# Patient Record
Sex: Female | Born: 1995 | Race: Black or African American | Hispanic: No | Marital: Single | Smoking: Current every day smoker
Health system: Southern US, Community
[De-identification: ages and names within clinical notes are randomized; demographics above are authoritative.]

## PROBLEM LIST (undated history)

## (undated) DIAGNOSIS — F32A Depression, unspecified: Secondary | ICD-10-CM

## (undated) DIAGNOSIS — B009 Herpesviral infection, unspecified: Secondary | ICD-10-CM

## (undated) DIAGNOSIS — G4730 Sleep apnea, unspecified: Secondary | ICD-10-CM

## (undated) DIAGNOSIS — F419 Anxiety disorder, unspecified: Secondary | ICD-10-CM

## (undated) DIAGNOSIS — T7840XA Allergy, unspecified, initial encounter: Secondary | ICD-10-CM

## (undated) DIAGNOSIS — F329 Major depressive disorder, single episode, unspecified: Secondary | ICD-10-CM

## (undated) HISTORY — DX: Sleep apnea, unspecified: G47.30

## (undated) HISTORY — DX: Major depressive disorder, single episode, unspecified: F32.9

## (undated) HISTORY — DX: Allergy, unspecified, initial encounter: T78.40XA

## (undated) HISTORY — DX: Anxiety disorder, unspecified: F41.9

## (undated) HISTORY — DX: Depression, unspecified: F32.A

---

## 2016-01-05 ENCOUNTER — Emergency Department (HOSPITAL_COMMUNITY)
Admission: EM | Admit: 2016-01-05 | Discharge: 2016-01-05 | Disposition: A | Discharge status: Home / Self Care | Payer: 59 | Attending: Emergency Medicine | Admitting: Emergency Medicine

## 2016-01-05 ENCOUNTER — Encounter (HOSPITAL_COMMUNITY): Payer: Self-pay | Admitting: Emergency Medicine

## 2016-01-05 DIAGNOSIS — F17200 Nicotine dependence, unspecified, uncomplicated: Secondary | ICD-10-CM | POA: Diagnosis not present

## 2016-01-05 DIAGNOSIS — N766 Ulceration of vulva: Secondary | ICD-10-CM | POA: Insufficient documentation

## 2016-01-05 DIAGNOSIS — N760 Acute vaginitis: Secondary | ICD-10-CM | POA: Diagnosis not present

## 2016-01-05 DIAGNOSIS — B9689 Other specified bacterial agents as the cause of diseases classified elsewhere: Secondary | ICD-10-CM | POA: Insufficient documentation

## 2016-01-05 DIAGNOSIS — N898 Other specified noninflammatory disorders of vagina: Secondary | ICD-10-CM | POA: Diagnosis present

## 2016-01-05 DIAGNOSIS — R3989 Other symptoms and signs involving the genitourinary system: Secondary | ICD-10-CM

## 2016-01-05 LAB — URINALYSIS, ROUTINE W REFLEX MICROSCOPIC
BILIRUBIN URINE: NEGATIVE
Glucose, UA: NEGATIVE mg/dL
Hgb urine dipstick: NEGATIVE
KETONES UR: NEGATIVE mg/dL
NITRITE: NEGATIVE
PROTEIN: NEGATIVE mg/dL
Specific Gravity, Urine: 1.031 — ABNORMAL HIGH (ref 1.005–1.030)
pH: 6.5 (ref 5.0–8.0)

## 2016-01-05 LAB — URINE MICROSCOPIC-ADD ON: RBC / HPF: NONE SEEN RBC/hpf (ref 0–5)

## 2016-01-05 LAB — WET PREP, GENITAL
Sperm: NONE SEEN
Trich, Wet Prep: NONE SEEN
Yeast Wet Prep HPF POC: NONE SEEN

## 2016-01-05 LAB — POC URINE PREG, ED: PREG TEST UR: NEGATIVE

## 2016-01-05 MED ORDER — FLUCONAZOLE 200 MG PO TABS: 200.0000 mg | ORAL_TABLET | Freq: Every day | ORAL | 0 refills | Status: AC

## 2016-01-05 MED ORDER — VALACYCLOVIR HCL 1 G PO TABS: 1000.0000 mg | ORAL_TABLET | Freq: Two times a day (BID) | ORAL | 0 refills | Status: AC

## 2016-01-05 MED ORDER — METRONIDAZOLE 500 MG PO TABS: 500.0000 mg | ORAL_TABLET | Freq: Two times a day (BID) | ORAL | 0 refills | Status: DC

## 2016-01-05 NOTE — ED Notes (Signed)
Pa at bedside updating pt 

## 2016-01-05 NOTE — ED Provider Notes (Signed)
[facility]PT Provider Note   CSN: [id number] Arrival date & time: 01/05/16  2115  By signing my name below, I, Linna DarnerRussell Turner, attest that this documentation has been prepared under the direction and in the presence of Kerrie BuffaloHope Neese, NP. Electronically Signed: Linna Darnerussell Turner, Scribe. 01/05/2016. 9:50 PM.  History   Chief Complaint Chief Complaint  Patient presents with  . Vaginal Discharge    The history is provided by the patient. No language interpreter was used.  Vaginal Discharge   This is a new problem. The current episode started more than 2 days ago. The problem occurs daily. The problem has not changed since onset.The discharge occurs spontaneously. The discharge was thick and yellow. She is not pregnant. Associated symptoms include abdominal pain, dysuria, genital burning, genital itching, genital lesions and perineal pain. Pertinent negatives include no fever, no constipation, no diarrhea, no nausea, no vomiting and no frequency. She has tried nothing for the symptoms.     HPI Comments: Summer Martin is a 20 y.o. female who presents to the Emergency Department complaining of sudden onset, constant, vaginal discharge for the last 3-4 days. She describes the discharge as yellow-tinted and very thick. Pt also notes some left-sided abdominal pain and dysuria since onset. Pt reports some sores/lesions around her vagina and soreness between her vagina and anus. She endorses itching around her vagina. She states she is sexually active and has been on birth control in the past, but is not on birth control currently. Pt reports she has been with her current sexual partner for 7 months and sometimes uses condoms. She states she is having oral sex as well. Pt notes her current sexual partner does not have any similar symptoms to her knowledge. No treatments or medications tried. No h/o pregnancy. She denies fever, chills, frequency, urgency, or any other associated symptoms.  History  reviewed. No pertinent past medical history.  There are no active problems to display for this patient.   History reviewed. No pertinent surgical history.  OB History    No data available       Home Medications    Prior to Admission medications   Medication Sig Start Date End Date Taking? Authorizing Provider  fluconazole (DIFLUCAN) 200 MG tablet Take 1 tablet (200 mg total) by mouth daily. 01/05/16 01/12/16  Hope Orlene OchM Neese, NP  metroNIDAZOLE (FLAGYL) 500 MG tablet Take 1 tablet (500 mg total) by mouth 2 (two) times daily. 01/05/16   Hope Orlene OchM Neese, NP  valACYclovir (VALTREX) 1000 MG tablet Take 1 tablet (1,000 mg total) by mouth 2 (two) times daily. 01/05/16 01/15/16  Hope Orlene OchM Neese, NP    Family History No family history on file.  Social History Social History  Substance Use Topics  . Smoking status: Current Every Day Smoker  . Smokeless tobacco: Not on file  . Alcohol use Yes     Allergies   Review of patient's allergies indicates no known allergies.   Review of Systems Review of Systems  Constitutional: Negative for chills and fever.  Gastrointestinal: Positive for abdominal pain. Negative for constipation, diarrhea, nausea and vomiting.  Genitourinary: Positive for dysuria and vaginal discharge. Negative for frequency and urgency.  All other systems reviewed and are negative.   Physical Exam Updated Vital Signs BP 135/81 (BP Location: Left Arm)   Pulse 67   Temp 98.6 F (37 C) (Oral)   Resp 18   Ht 5\' 7"  (1.702 m)   Wt 97.5 kg   LMP 12/15/2015  SpO2 100%   BMI 33.67 kg/m   Physical Exam  Constitutional: She is oriented to person, place, and time. She appears well-developed and well-nourished. No distress.  HENT:  Head: Normocephalic and atraumatic.  Eyes: Conjunctivae and EOM are normal.  Neck: Neck supple. No tracheal deviation present.  Cardiovascular: Normal rate.   Pulmonary/Chest: Effort normal. No respiratory distress.  Abdominal: Soft. There  is no tenderness.  Genitourinary:  Genitourinary Comments: External genitalia with tender vesicular lesions on vaginal mucosa near the introitus. Yellow d/c vaginal vault, no CMT, no adnexal tenderness or mass palpated, uterus without palpable enlargement.   Musculoskeletal: Normal range of motion.  Neurological: She is alert and oriented to person, place, and time.  Skin: Skin is warm and dry.  Psychiatric: She has a normal mood and affect. Her behavior is normal.  Nursing note and vitals reviewed.   ED Treatments / Results  Labs (all labs ordered are listed, but only abnormal results are displayed) Labs Reviewed  WET PREP, GENITAL - Abnormal; Notable for the following:       Result Value   Clue Cells Wet Prep HPF POC PRESENT (*)    WBC, Wet Prep HPF POC MODERATE (*)    All other components within normal limits  URINALYSIS, ROUTINE W REFLEX MICROSCOPIC (NOT AT [facility]) - Abnormal; Notable for the following:    APPearance CLOUDY (*)    Specific Gravity, Urine 1.031 (*)    Leukocytes, UA SMALL (*)    All other components within normal limits  URINE MICROSCOPIC-ADD ON - Abnormal; Notable for the following:    Squamous Epithelial / LPF 6-30 (*)    Bacteria, UA RARE (*)    All other components within normal limits  HSV CULTURE AND TYPING  RPR  HIV ANTIBODY (ROUTINE TESTING)  POC URINE PREG, ED  GC/CHLAMYDIA PROBE AMP ([place]) NOT AT [facility]     Radiology No results found.  Procedures Procedures (including critical care time)  DIAGNOSTIC STUDIES: Oxygen Saturation is 100% on RA, normal by my interpretation.    COORDINATION OF CARE: 9:59 PM Discussed treatment plan with pt at bedside and pt agreed to plan.  Medications Ordered in ED Medications - No data to display   Initial Impression / Assessment and Plan / ED Course  I have reviewed the triage vital signs and the nursing notes.  Pertinent lab results that were available during my care of the patient were reviewed by  me and considered in my medical decision making (see chart for details).  Clinical Course   I personally performed the services described in this documentation, which was scribed in my presence. The recorded information has been reviewed and is accurate.   Final Clinical Impressions(s) / ED Diagnoses  20 y.o. female with vaginal d/c, vaginal pain and lesions to the vaginal mucosa stable for d/c without abdominal pain, fever or other problems. Will treat with Valtrex and flagyl while cultures pending. Discussed with the patient and all questioned fully answered. She will f/u at [facility].    Final diagnoses:  Bacterial vaginosis  Genital sore    New Prescriptions New Prescriptions   FLUCONAZOLE (DIFLUCAN) 200 MG TABLET    Take 1 tablet (200 mg total) by mouth daily.   METRONIDAZOLE (FLAGYL) 500 MG TABLET    Take 1 tablet (500 mg total) by mouth 2 (two) times daily.   VALACYCLOVIR (VALTREX) 1000 MG TABLET    Take 1 tablet (1,000 mg total) by mouth 2 (two) times  daily.     [address]Hope Carbon HillM Neese, NP 01/05/16 2258    Nelva Nayobert Beaton, MD 01/16/16 [phone]

## 2016-01-05 NOTE — ED Triage Notes (Signed)
Pt states "i've have a sore spot on my pelvic area, and discharge and soreness on my vaginal area. I had strep throat and they gave me antibiotics and ever since then i've had soreness in my vagina. And also its burned when i've peed. I've had yellow and white discharge from my vagina".

## 2016-01-05 NOTE — Discharge Instructions (Signed)
We have sent cultures for gonorrhea, chlamydia, herpes and blood for syphilis and HIV. Since the cultures will not be back for a few days we are starting you on medication for herpes because you have the sores. We are also treating the bacterial vaginosis.

## 2016-01-05 NOTE — ED Notes (Signed)
Patient able to ambulate independently  

## 2016-01-06 LAB — GC/CHLAMYDIA PROBE AMP (~~LOC~~) NOT AT ARMC
CHLAMYDIA, DNA PROBE: NEGATIVE
Neisseria Gonorrhea: NEGATIVE

## 2016-01-06 LAB — HIV ANTIBODY (ROUTINE TESTING W REFLEX): HIV Screen 4th Generation wRfx: NONREACTIVE

## 2016-01-06 LAB — RPR: RPR Ser Ql: NONREACTIVE

## 2016-01-08 LAB — HSV CULTURE AND TYPING

## 2016-01-09 ENCOUNTER — Encounter (HOSPITAL_COMMUNITY): Payer: Self-pay

## 2016-01-09 ENCOUNTER — Emergency Department (HOSPITAL_COMMUNITY)
Admission: EM | Admit: 2016-01-09 | Discharge: 2016-01-09 | Disposition: A | Discharge status: Home / Self Care | Payer: 59 | Attending: Emergency Medicine | Admitting: Emergency Medicine

## 2016-01-09 DIAGNOSIS — N949 Unspecified condition associated with female genital organs and menstrual cycle: Secondary | ICD-10-CM

## 2016-01-09 DIAGNOSIS — A6004 Herpesviral vulvovaginitis: Secondary | ICD-10-CM | POA: Insufficient documentation

## 2016-01-09 DIAGNOSIS — F17200 Nicotine dependence, unspecified, uncomplicated: Secondary | ICD-10-CM | POA: Diagnosis not present

## 2016-01-09 DIAGNOSIS — N898 Other specified noninflammatory disorders of vagina: Secondary | ICD-10-CM | POA: Diagnosis present

## 2016-01-09 HISTORY — DX: Herpesviral infection, unspecified: B00.9

## 2016-01-09 MED ORDER — MUPIROCIN CALCIUM 2 % EX CREA: 1.0000 "application " | TOPICAL_CREAM | Freq: Two times a day (BID) | CUTANEOUS | 0 refills | Status: DC

## 2016-01-09 NOTE — Discharge Instructions (Signed)
Continue taking the medications you were prescribed the other day, until completed. Use mupirocin cream as directed to the areas of irritation, to ensure you do not develop a secondary bacterial infection. Perform warm sitz baths 4 times daily. Keep area clean and dry. Avoid changes in soaps/condom brand, etc. Use dove soap which is gentle on your skin, avoid douches or harsh feminine hygiene products. May use over the counter desitin to the areas of soreness in order to provide some protection to the area. Follow up with your regular doctor in the next 3-5 days for recheck of symptoms. [facility] for changes or worsening symptoms.

## 2016-01-09 NOTE — ED Provider Notes (Signed)
[facility] Provider Note   CSN: [id number] Arrival date & time: 01/09/16  1610     History   Chief Complaint No chief complaint on file.   HPI Summer Martin is a 20 y.o. female who presents to the ED with complaints of ongoing vaginal burning and has been present for 6 days. Patient was seen on 01/05/16 for the symptoms. 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 She has been compliant with all of her medications but continues to have vaginal burning. She states that the burning sensation is mostly around the genital sores she has, worse with touching or with anything coming into contact with the sores, and unrelieved with the medications given at her prior visit. She has not tried anything else since she left the hospital the last time, previous to that she was using an over-the-counter yeast cream but that did not help so she stopped using that since her last visit. Associated symptoms include blisterlike genital sores that have some surrounding swelling, but no redness/drainage/warmth to the area. She also has some yellowish thin watery vaginal discharge which has persisted despite taking the medications as directed. Occasionally she gets some vaginal itching but that seems to have improved. She states that just prior to onset of symptoms she had changed soaps and the condom brand that she was using. No other known precipitants, no other changes in detergents, lotions, or feminine products. She is sexually active with 3 female partners, sometimes unprotected.  She denies any fevers, chills, chest pain, shortness breath, abdominal pain, nausea, vomiting, diarrhea, constipation, dysuria, hematuria, vaginal bleeding,  numbness, tingling, or focal weakness. Denies any other associated symptoms.   The history is provided by the patient and medical records. No language interpreter was used.    History reviewed. No pertinent past medical history.  There are no active problems to display for this patient.   History reviewed. No pertinent surgical history.  OB History    No data available       Home Medications    Prior to Admission medications   Medication Sig Start Date End Date Taking? Authorizing Provider  fluconazole (DIFLUCAN) 200 MG tablet Take 1 tablet (200 mg total) by mouth daily. 01/05/16 01/12/16  Hope Orlene OchM Neese, NP  metroNIDAZOLE (FLAGYL) 500 MG tablet Take 1 tablet (500 mg total) by mouth 2 (two) times daily. 01/05/16   Hope Orlene OchM Neese, NP  valACYclovir (VALTREX) 1000 MG tablet Take 1 tablet (1,000 mg total) by mouth 2 (two) times daily. 01/05/16 01/15/16  Hope Orlene OchM Neese, NP    Family History No family history on file.  Social History Social History  Substance Use Topics  . Smoking status: Current Every Day Smoker  . Smokeless tobacco: Not on file  . Alcohol use Yes     Allergies   Review of patient's allergies indicates no known allergies.   Review of Systems Review of Systems  Constitutional: Negative for chills and fever.  Respiratory: Negative for shortness of breath.   Cardiovascular: Negative for chest pain.  Gastrointestinal: Negative for abdominal pain, constipation, diarrhea, nausea and vomiting.  Genitourinary: Positive for genital sores, vaginal discharge (thin watery and yellowish) and vaginal pain (burning, itching). Negative for dysuria, hematuria and vaginal  bleeding.  Musculoskeletal: Negative for arthralgias and myalgias.  Skin: Negative for color change.  Allergic/Immunologic: Negative for immunocompromised state.  Neurological: Negative for weakness and numbness.  Psychiatric/Behavioral: Negative for confusion.   10 Systems reviewed and are negative for  acute change except as noted in the HPI.   Physical Exam Updated Vital Signs BP 112/71 (BP Location: Right Arm)   Pulse 65   Temp 98.6 F (37 C) (Oral)   Resp 20   LMP 12/15/2015   SpO2 98%   Physical Exam  Constitutional: She is oriented to person, place, and time. Vital signs are normal. She appears well-developed and well-nourished.  Non-toxic appearance. No distress.  Afebrile, nontoxic, NAD  HENT:  Head: Normocephalic and atraumatic.  Mouth/Throat: Oropharynx is clear and moist and mucous membranes are normal.  Eyes: Conjunctivae and EOM are normal. Right eye exhibits no discharge. Left eye exhibits no discharge.  Neck: Normal range of motion. Neck supple.  Cardiovascular: Normal rate, regular rhythm, normal heart sounds and intact distal pulses.  Exam reveals no gallop and no friction rub.   No murmur heard. Pulmonary/Chest: Effort normal and breath sounds normal. No respiratory distress. She has no decreased breath sounds. She has no wheezes. She has no rhonchi. She has no rales.  Abdominal: Soft. Normal appearance and bowel sounds are normal. She exhibits no distension. There is no tenderness. There is no rigidity, no rebound, no guarding, no CVA tenderness, no tenderness at McBurney's point and negative Murphy's sign.  Soft, NTND, +BS throughout, no r/g/r, neg murphy's, neg mcburney's, no CVA TTP   Genitourinary: Pelvic exam was performed with patient supine. There is rash and tenderness on the right labia. There is rash and tenderness on the left labia.  Genitourinary Comments: Chaperone present for exam External genitalia with multiple vesicular lesions to both labia, mild swelling around the lesions, but no induration/fluctuance, warmth, spreading erythema, or drainage/weeping from lesions. Lesions moderately TTP. No surrounding cellulitis. Internal exam deferred due to recent pelvic exam.   Musculoskeletal: Normal range of motion.  Neurological: She is alert and oriented to  person, place, and time. She has normal strength. No sensory deficit.  Skin: Skin is warm, dry and intact. No rash noted.  Psychiatric: She has a normal mood and affect.  Nursing note and vitals reviewed.    ED Treatments / Results  Labs (all labs ordered are listed, but only abnormal results are displayed) Labs Reviewed - No data to display  Results for orders placed or performed during the hospital encounter of 01/05/16  Wet prep, genital  Result Value Ref Range   Yeast Wet Prep HPF POC NONE SEEN NONE SEEN   Trich, Wet Prep NONE SEEN NONE SEEN   Clue Cells Wet Prep HPF POC PRESENT (A) NONE SEEN   WBC, Wet Prep HPF POC MODERATE (A) NONE SEEN   Sperm NONE SEEN   Hsv Culture And Typing  Result Value Ref Range   HSV Culture/Type Comment (A)    Source of Sample NONE   Urinalysis, Routine w reflex microscopic- may I&O cath if menses  Result Value Ref Range   Color, Urine YELLOW YELLOW   APPearance CLOUDY (A) CLEAR   Specific Gravity, Urine 1.031 (H) 1.005 - 1.030   pH 6.5 5.0 - 8.0   Glucose, UA NEGATIVE NEGATIVE mg/dL   Hgb urine dipstick NEGATIVE NEGATIVE   Bilirubin Urine NEGATIVE NEGATIVE   Ketones, ur NEGATIVE NEGATIVE mg/dL   Protein, ur NEGATIVE NEGATIVE mg/dL   Nitrite  NEGATIVE NEGATIVE   Leukocytes, UA SMALL (A) NEGATIVE  RPR  Result Value Ref Range   RPR Ser Ql Non Reactive Non Reactive  HIV antibody (routine testing) (NOT for [facility])  Result Value Ref Range   HIV Screen 4th Generation wRfx Non Reactive Non Reactive  Urine microscopic-add on  Result Value Ref Range   Squamous Epithelial / LPF 6-30 (A) NONE SEEN   WBC, UA 0-5 0 - 5 WBC/hpf   RBC / HPF NONE SEEN 0 - 5 RBC/hpf   Bacteria, UA RARE (A) NONE SEEN  POC urine preg, ED  Result Value Ref Range   Preg Test, Ur NEGATIVE NEGATIVE  GC/Chlamydia probe amp ([place])not at [facility]  Result Value Ref Range   Chlamydia Negative    Neisseria gonorrhea Negative    HSV test 01/05/16: Comments: (NOTE)  Positive  for Herpes simplex virus type-2. Typing was confirmed by  monoclonal antibody microscopic immunofluorescence.  Performed At: [facility]  [address] [place], [place] [id number]  Mila HomerHancock William F MD WU:[id number]:[phone]   EKG  EKG Interpretation None       Radiology No results found.  Procedures Procedures (including critical care time)  Medications Ordered in ED Medications - No data to display   Initial Impression / Assessment and Plan / ED Course  I have reviewed the triage vital signs and the nursing notes.  Pertinent labs & imaging results that were available during my care of the patient were reviewed by me and considered in my medical decision making (see chart for details).  Clinical Course    20 y.o. female here with ongoing vaginal burning/irritation, and genital sores. 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 Sent home with flagyl and diflucan as well. Upreg that day neg, U/A that day unremarkable for UTI. On exam today, labia with vesicular lesions, mild swelling of labia around the lesions, no drainage or erythema, no warmth, doesn't appear to be cellulitic, but the swelling makes me worried that she could be developing an early secondary bacterial infection. Discussed continuing her flagyl/valtrex as previously described, and will start on mupirocin cream; want to avoid PO abx since this could cause a yeast infection, which would only worsen her situation. Discussed desitin and barrier creams to help with pain. Sitz baths, keep area clean/dry. Change back to prior soaps/etc. Avoid harsh detergents or feminine products. Doubt need for further emergent work up or repeat pelvic exam since she just had one and testing was otherwise neg aside from the HSV test. F/up with PCP in  3-5 days for recheck, and strict return precautions discussed. I explained the diagnosis and have given explicit precautions to return to the ER including for any other new or worsening symptoms. The patient understands and accepts the medical plan as it's been dictated and I have answered their questions. Discharge instructions concerning home care and prescriptions have been given. The patient is STABLE and is discharged to home in good condition.    Final Clinical Impressions(s) / ED Diagnoses   Final diagnoses:  Vaginal burning  Herpes simplex vulvovaginitis    New Prescriptions New Prescriptions   MUPIROCIN CREAM (BACTROBAN) 2 %    Apply 1 application topically 2 (two) times daily. Apply to labia lesions twice daily x 7 days     [facility] IncMercedes  Camprubi-Soms, PA-C 01/09/16 1722    Dione Booze, MD 01/10/16 [phone]

## 2016-01-09 NOTE — ED Notes (Signed)
Pt stable, ambulatory, states understanding of discharge instructions 

## 2016-01-09 NOTE — ED Triage Notes (Signed)
Patient here to be rechecked for recurrent vaginal pain and burning. Seen on Tuesday for same and taking meds for bacterial vaginosis. NAD

## 2017-06-17 ENCOUNTER — Emergency Department
Admission: EM | Admit: 2017-06-17 | Discharge: 2017-06-17 | Disposition: A | Discharge status: Home / Self Care | Payer: Commercial Managed Care - HMO | Attending: Emergency Medicine | Admitting: Emergency Medicine

## 2017-06-17 ENCOUNTER — Other Ambulatory Visit: Payer: Self-pay

## 2017-06-17 ENCOUNTER — Emergency Department: Payer: Commercial Managed Care - HMO

## 2017-06-17 DIAGNOSIS — N739 Female pelvic inflammatory disease, unspecified: Secondary | ICD-10-CM | POA: Diagnosis not present

## 2017-06-17 DIAGNOSIS — N730 Acute parametritis and pelvic cellulitis: Secondary | ICD-10-CM

## 2017-06-17 DIAGNOSIS — F17210 Nicotine dependence, cigarettes, uncomplicated: Secondary | ICD-10-CM | POA: Insufficient documentation

## 2017-06-17 DIAGNOSIS — R109 Unspecified abdominal pain: Secondary | ICD-10-CM | POA: Diagnosis present

## 2017-06-17 LAB — CBC
HCT: 35.5 % (ref 35.0–47.0)
HEMOGLOBIN: 12.1 g/dL (ref 12.0–16.0)
MCH: 29.3 pg (ref 26.0–34.0)
MCHC: 34.1 g/dL (ref 32.0–36.0)
MCV: 85.8 fL (ref 80.0–100.0)
PLATELETS: 419 10*3/uL (ref 150–440)
RBC: 4.14 MIL/uL (ref 3.80–5.20)
RDW: 13.8 % (ref 11.5–14.5)
WBC: 8.3 10*3/uL (ref 3.6–11.0)

## 2017-06-17 LAB — URINALYSIS, COMPLETE (UACMP) WITH MICROSCOPIC
Bilirubin Urine: NEGATIVE
GLUCOSE, UA: NEGATIVE mg/dL
Hgb urine dipstick: NEGATIVE
Ketones, ur: 20 mg/dL — AB
Nitrite: NEGATIVE
PROTEIN: 30 mg/dL — AB
Specific Gravity, Urine: 1.027 (ref 1.005–1.030)
pH: 5 (ref 5.0–8.0)

## 2017-06-17 LAB — WET PREP, GENITAL
SPERM: NONE SEEN
Trich, Wet Prep: NONE SEEN
Yeast Wet Prep HPF POC: NONE SEEN

## 2017-06-17 LAB — COMPREHENSIVE METABOLIC PANEL
ALBUMIN: 4.7 g/dL (ref 3.5–5.0)
ALK PHOS: 62 U/L (ref 38–126)
ALT: 21 U/L (ref 14–54)
AST: 28 U/L (ref 15–41)
Anion gap: 8 (ref 5–15)
BUN: 13 mg/dL (ref 6–20)
CALCIUM: 9.3 mg/dL (ref 8.9–10.3)
CO2: 24 mmol/L (ref 22–32)
CREATININE: 1.17 mg/dL — AB (ref 0.44–1.00)
Chloride: 105 mmol/L (ref 101–111)
GFR calc Af Amer: >60 mL/min (ref >60)
GFR calc non Af Amer: >60 mL/min (ref >60)
GLUCOSE: 106 mg/dL — AB (ref 65–99)
Potassium: 3.5 mmol/L (ref 3.5–5.1)
Sodium: 137 mmol/L (ref 135–145)
Total Bilirubin: 1.1 mg/dL (ref 0.3–1.2)
Total Protein: 8.1 g/dL (ref 6.5–8.1)

## 2017-06-17 LAB — LIPASE, BLOOD: Lipase: 24 U/L (ref 11–51)

## 2017-06-17 LAB — CHLAMYDIA/NGC RT PCR (ARMC ONLY)
Chlamydia Tr: NOT DETECTED
N gonorrhoeae: NOT DETECTED

## 2017-06-17 LAB — POCT PREGNANCY, URINE: Preg Test, Ur: NEGATIVE

## 2017-06-17 MED ORDER — LIDOCAINE HCL (PF) 1 % IJ SOLN
INTRAMUSCULAR | Status: AC
Start: 2017-06-17 — End: 2017-06-17
  Administered 2017-06-17: 5 mL
  Filled 2017-06-17: qty 5

## 2017-06-17 MED ORDER — SODIUM CHLORIDE 0.9 % IV BOLUS
1000.0000 mL | Freq: Once | INTRAVENOUS | Status: AC
  Administered 2017-06-17: 1000 mL via INTRAVENOUS

## 2017-06-17 MED ORDER — MORPHINE SULFATE (PF) 2 MG/ML IV SOLN
INTRAVENOUS | Status: AC
  Filled 2017-06-17: qty 1

## 2017-06-17 MED ORDER — ONDANSETRON HCL 4 MG/2ML IJ SOLN
4.0000 mg | Freq: Once | INTRAMUSCULAR | Status: AC
  Administered 2017-06-17: 4 mg via INTRAVENOUS

## 2017-06-17 MED ORDER — IOPAMIDOL (ISOVUE-300) INJECTION 61%
100.0000 mL | Freq: Once | INTRAVENOUS | Status: AC | PRN
  Administered 2017-06-17: 100 mL via INTRAVENOUS

## 2017-06-17 MED ORDER — ONDANSETRON HCL 4 MG/2ML IJ SOLN
INTRAMUSCULAR | Status: AC
  Filled 2017-06-17: qty 2

## 2017-06-17 MED ORDER — MORPHINE SULFATE (PF) 2 MG/ML IV SOLN
2.0000 mg | Freq: Once | INTRAVENOUS | Status: AC
  Administered 2017-06-17: 2 mg via INTRAVENOUS

## 2017-06-17 MED ORDER — CEFTRIAXONE SODIUM 250 MG IJ SOLR
250.0000 mg | Freq: Once | INTRAMUSCULAR | Status: AC
  Administered 2017-06-17: 250 mg via INTRAMUSCULAR
  Filled 2017-06-17: qty 250

## 2017-06-17 MED ORDER — DOXYCYCLINE HYCLATE 100 MG PO CAPS: 100.0000 mg | ORAL_CAPSULE | Freq: Two times a day (BID) | ORAL | 0 refills | Status: AC

## 2017-06-17 MED ORDER — FLUCONAZOLE 100 MG PO TABS
150.0000 mg | ORAL_TABLET | Freq: Once | ORAL | Status: AC
  Administered 2017-06-17: 150 mg via ORAL
  Filled 2017-06-17: qty 1

## 2017-06-17 MED ORDER — LIDOCAINE HCL (PF) 1 % IJ SOLN
0.9000 mL | Freq: Once | INTRAMUSCULAR | Status: AC
  Administered 2017-06-17: 5 mL

## 2017-06-17 MED ORDER — OXYCODONE-ACETAMINOPHEN 5-325 MG PO TABS: 1.0000 | ORAL_TABLET | ORAL | 0 refills | Status: AC | PRN

## 2017-06-17 NOTE — ED Provider Notes (Signed)
[facility] Emergency Department Provider Note    First MD Initiated Contact with Patient 06/17/17 1346     (approximate)  I have reviewed the triage vital signs and the nursing notes.   HISTORY  Chief Complaint Pelvic Pain    HPI Summer Martin is a 22 y.o. female presents to the emergency department with 10 out of 10 generalized abdominal pain which patient states began on Monday. Patient denies any nausea no vomiting diarrhea constipation. Patient denies any dysuria frequency or urgency. Patient denies any fever. Patient was seen at [facility] twice this week and diagnosed with a uterine cysts chart review does reveal a cyst in the fundus of the uterus.patient does admit to vaginal discharge and unprotected sex.   Past Medical History:  Diagnosis Date  . Herpes     There are no active problems to display for this patient.   Past surgical history  None  Prior to Admission medications   Medication Sig Start Date End Date Taking? Authorizing Provider  doxycycline (VIBRAMYCIN) 100 MG capsule Take 1 capsule (100 mg total) by mouth 2 (two) times daily for 14 days. 06/17/17 07/01/17  Darci Current, MD  metroNIDAZOLE (FLAGYL) 500 MG tablet Take 1 tablet (500 mg total) by mouth 2 (two) times daily. 01/05/16   Janne Napoleon, NP  mupirocin cream (BACTROBAN) 2 % Apply 1 application topically 2 (two) times daily. Apply to labia lesions twice daily x 7 days 01/09/16   Street, [place], PA-C  oxyCODONE-acetaminophen (PERCOCET) 5-325 MG tablet Take 1 tablet by mouth every 4 (four) hours as needed for severe pain. 06/17/17 06/17/18  Darci Current, MD    Allergies no known drug allergies History reviewed. No pertinent family history.  Social History Social History   Tobacco Use  . Smoking status: Current Every Day Smoker  Substance Use Topics  . Alcohol use: Yes  . Drug use: Not on file   family history: appendicitis mother and brother at a similar  age  Review of Systems Constitutional: No fever/chills Eyes: No visual changes. ENT: No sore throat. Cardiovascular: Denies chest pain. Respiratory: Denies shortness of breath. Gastrointestinal: No abdominal pain.  No nausea, no vomiting.  No diarrhea.  No constipation. Genitourinary: Negative for dysuria. Musculoskeletal: Negative for neck pain.  Negative for back pain. Integumentary: Negative for rash. Neurological: Negative for headaches, focal weakness or numbness.   ____________________________________________   PHYSICAL EXAM:  VITAL SIGNS: ED Triage Vitals  Enc Vitals Group     BP 06/17/17 1120 139/77     Pulse Rate 06/17/17 1120 68     Resp 06/17/17 1120 18     Temp 06/17/17 1120 98.7 F (37.1 C)     Temp Source 06/17/17 1120 Oral     SpO2 06/17/17 1120 99 %     Weight 06/17/17 1121 85.3 kg (188 lb)     Height 06/17/17 1121 1.702 m (5\' 7" )     Head Circumference --      Peak Flow --      Pain Score 06/17/17 1121 7     Pain Loc --      Pain Edu? --      Excl. in GC? --     Constitutional: Alert and oriented. Well appearing and in no acute distress. Eyes: Conjunctivae are normal. PERRL. EOMI. Head: Atraumatic. Mouth/Throat: Mucous membranes are moist.  Oropharynx non-erythematous. Neck: No stridor.   Cardiovascular: Normal rate, regular rhythm. Good peripheral circulation. Grossly normal heart sounds.  Respiratory: Normal respiratory effort.  No retractions. Lungs CTAB. Gastrointestinal: Soft and nontender. No distention.  Musculoskeletal: No lower extremity tenderness nor edema. No gross deformities of extremities. Neurologic:  Normal speech and language. No gross focal neurologic deficits are appreciated.  Skin:  Skin is warm, dry and intact. No rash noted. Psychiatric: Mood and affect are normal. Speech and behavior are normal.  ____________________________________________   LABS (all labs ordered are listed, but only abnormal results are  displayed)  Labs Reviewed  WET PREP, GENITAL - Abnormal; Notable for the following components:      Result Value   Clue Cells Wet Prep HPF POC PRESENT (*)    WBC, Wet Prep HPF POC FEW (*)    All other components within normal limits  COMPREHENSIVE METABOLIC PANEL - Abnormal; Notable for the following components:   Glucose, Bld 106 (*)    Creatinine, Ser 1.17 (*)    All other components within normal limits  URINALYSIS, COMPLETE (UACMP) WITH MICROSCOPIC - Abnormal; Notable for the following components:   Color, Urine YELLOW (*)    APPearance CLOUDY (*)    Ketones, ur 20 (*)    Protein, ur 30 (*)    Leukocytes, UA SMALL (*)    Bacteria, UA RARE (*)    Squamous Epithelial / LPF 6-30 (*)    All other components within normal limits  CHLAMYDIA/NGC RT PCR ([facility] ONLY)  LIPASE, BLOOD  CBC  POC URINE PREG, ED  POCT PREGNANCY, URINE     RADIOLOGY I,  N Krystyl Cannell, personally viewed and evaluated these images (plain radiographs) as part of my medical decision making, as well as reviewing the written report by the radiologist.  ED MD interpretation:  fluid noted in the endometrial cavity of the uterus and cervix. Per radiologist  Official radiology report(s): Ct Abdomen Pelvis W Contrast  Result Date: 06/17/2017 CLINICAL DATA:  Abdominal pain with vomiting EXAM: CT ABDOMEN AND PELVIS WITH CONTRAST TECHNIQUE: Multidetector CT imaging of the abdomen and pelvis was performed using the standard protocol following bolus administration of intravenous contrast. CONTRAST:  100mL ISOVUE-300 IOPAMIDOL (ISOVUE-300) INJECTION 61% COMPARISON:  None. FINDINGS: Lower chest: Lung bases are clear. Hepatobiliary: No focal liver lesions are appreciable. Gallbladder wall is not appreciably thickened. There is no biliary duct dilatation. Pancreas: No pancreatic mass or inflammatory focus. Spleen: No splenic lesions are evident. Adrenals/Urinary Tract: Adrenals bilaterally appear normal. Kidneys  bilaterally show no evident mass or hydronephrosis on either side. There is a junctional parenchymal defect in the left kidney, an anatomic variant. There is no evident renal or ureteral calculus on either side. Urinary bladder is midline with wall thickness within normal limits. Stomach/Bowel: There is no appreciable bowel wall or mesenteric thickening. No evident bowel obstruction. No free air or portal venous air. Vascular/Lymphatic: No abdominal aortic aneurysm. No vascular lesions are evident. There is no appreciable adenopathy in the abdomen or pelvis. Reproductive: The uterus is retroverted. There is moderate fluid in the endometrium. There is fluid in the cervix. There is a small cystic area in the right ovary measuring 2.6 x 1.8 cm with adjacent fluid surrounding this cystic area as well as fluid in the cul-de-sac region. Other: Appendix appears normal. No abscess is evident in the abdomen pelvis. No ascites evident beyond fluid in the cul-de-sac. Musculoskeletal: No blastic or lytic bone lesions. No intramuscular or abdominal wall lesion evident. IMPRESSION: 1. Fluid in the endometrium and cervix. Uterus retroverted but not appreciably enlarged. 2. Cyst versus dominant follicle right  ovary with adjacent fluid as well as fluid in the cul-de-sac. Suspect recent ovarian cyst leakage/rupture. 3.  No bowel obstruction.  No abscess.  Appendix appears normal. 4.  No renal or ureteral calculus.  No hydronephrosis. Electronically Signed   By: Bretta Bang III M.D.   On: 06/17/2017 14:41      Procedures   ____________________________________________   INITIAL IMPRESSION / ASSESSMENT AND PLAN / ED COURSE  As part of my medical decision making, I reviewed the following data within the electronic MEDICAL RECORD NUMBER   22 year old female presented with above stated history and physical exam secondary to pelvic/abdominal discomfort. Considered possibly of appendicitis colitis and PID given history and as  such CT scan of the abdomen was performed which revealed no evidence of appendicitis or colitis. CT revealed a fluid filled endometrium/cervix patient is not currently on menses. Genitourinary exam was positive for cervical motion tenderness and a such concern for possible pelvic inflammatory disease. Patient was given IM ceftriaxone 250 mg as well as azithromycin 1 g. Patient will be prescribed doxycycline for home. Patient advised to follow-up with OB/GYN for further outpatient evaluation    ____________________________________________  FINAL CLINICAL IMPRESSION(S) / ED DIAGNOSES  Final diagnoses:  PID (acute pelvic inflammatory disease)     MEDICATIONS GIVEN DURING THIS VISIT:  Medications  ondansetron (ZOFRAN) injection 4 mg (4 mg Intravenous Given 06/17/17 1355)  morphine 2 MG/ML injection 2 mg (2 mg Intravenous Given 06/17/17 1356)  iopamidol (ISOVUE-300) 61 % injection 100 mL (100 mLs Intravenous Contrast Given 06/17/17 1426)  sodium chloride 0.9 % bolus 1,000 mL (0 mLs Intravenous Stopped 06/17/17 1635)  cefTRIAXone (ROCEPHIN) injection 250 mg (250 mg Intramuscular Given 06/17/17 1634)  fluconazole (DIFLUCAN) tablet 150 mg (150 mg Oral Given 06/17/17 1633)  lidocaine (PF) (XYLOCAINE) 1 % injection 0.9 mL (5 mLs Other Given 06/17/17 1635)     ED Discharge Orders        Ordered    doxycycline (VIBRAMYCIN) 100 MG capsule  2 times daily     06/17/17 1534    oxyCODONE-acetaminophen (PERCOCET) 5-325 MG tablet  Every 4 hours PRN     06/17/17 1534       Note:  This document was prepared using Dragon voice recognition software and may include unintentional dictation errors.    Darci Current, MD 06/18/17 [phone]

## 2017-06-17 NOTE — ED Triage Notes (Signed)
Pt states few days ago she was told she has a cyst in her uterus. Pt states she has missed 4 days of work d/t pain and decreased appetite. Pt states she can't keep food down and is vomiting. [facility]. Has prescription for nausea medicine but states it's not working. Alert. Oriented, ambulatory. Denies bleeding/discharge. States "a little bit of moisture."

## 2018-03-20 ENCOUNTER — Ambulatory Visit: Payer: 59 | Admitting: Physical Therapy

## 2018-03-27 ENCOUNTER — Encounter: Payer: Self-pay | Admitting: Physical Therapy

## 2018-03-27 ENCOUNTER — Other Ambulatory Visit: Payer: Self-pay

## 2018-03-27 ENCOUNTER — Ambulatory Visit: Payer: Commercial Managed Care - HMO | Attending: Obstetrics and Gynecology | Admitting: Physical Therapy

## 2018-03-27 DIAGNOSIS — R278 Other lack of coordination: Secondary | ICD-10-CM

## 2018-03-27 DIAGNOSIS — R293 Abnormal posture: Secondary | ICD-10-CM | POA: Diagnosis present

## 2018-03-27 DIAGNOSIS — M62838 Other muscle spasm: Secondary | ICD-10-CM | POA: Insufficient documentation

## 2018-03-27 NOTE — Patient Instructions (Addendum)
Smoking Cessation:   Free program at [facility]:        [facility]     [address]     [place], [place] [address]  Contact  [employer] at [phone] ___    Avoid straining pelvic floor, abdominal muscles , spine  Use log rolling technique instead of getting out of bed with your neck or the sit-up     Log rolling into and out of bed If getting out of bed on L side, 1) Bent knees, scoot hips/ shoulder to R   Raise L arm completely overhead, rolling onto armpit  Then lower bent knees to bed to get into complete side lying position  Then drop legs off bed, and push up onto L elbow/forearm, and use R hand to push onto the bend   _______  Modification to leg lifts   No arch ing back, stabilization arms, opp heel  Exhale to lift leg   Hold off on crunches and sit ups   ________ Stainless stell bottle for watter, no plastic   Increase to total of 48 fl oz per day esp esp when working out   _______ Deep core level 1 ( handout)
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
## 2018-04-01 NOTE — Addendum Note (Signed)
Addended by: Mariane Masters on: 04/01/2018 11:31 AM   Modules accepted: Orders

## 2018-04-03 ENCOUNTER — Ambulatory Visit: Payer: Commercial Managed Care - HMO | Admitting: Physical Therapy

## 2018-04-03 DIAGNOSIS — R278 Other lack of coordination: Secondary | ICD-10-CM | POA: Diagnosis not present

## 2018-04-03 DIAGNOSIS — M62838 Other muscle spasm: Secondary | ICD-10-CM

## 2018-04-03 DIAGNOSIS — R293 Abnormal posture: Secondary | ICD-10-CM

## 2018-04-03 NOTE — Patient Instructions (Signed)
Foot Arch lifts to minimize L pelvic floor tightness  more anterior placement of navel over ballmounds ( hinge)  , track knees out in the pleats  20 reps low up and down    Single leg, knees unlocked, foot arch lifted ( 4 corners)  Push off with back foot as you lean forward with the trunk  20 reps each leg    ____  Hold off on squats   Leg press" track knees out, push weight with 4 corners of feet, toes spreads ( arch engaged) , exhaling on exertion
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
## 2018-04-10 ENCOUNTER — Ambulatory Visit: Payer: 59 | Admitting: Physical Therapy

## 2018-04-17 ENCOUNTER — Ambulatory Visit: Payer: 59 | Attending: Obstetrics and Gynecology | Admitting: Physical Therapy

## 2018-04-17 DIAGNOSIS — M62838 Other muscle spasm: Secondary | ICD-10-CM | POA: Diagnosis present

## 2018-04-17 DIAGNOSIS — R293 Abnormal posture: Secondary | ICD-10-CM | POA: Insufficient documentation

## 2018-04-17 DIAGNOSIS — R278 Other lack of coordination: Secondary | ICD-10-CM | POA: Diagnosis present
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
## 2018-04-17 NOTE — Patient Instructions (Addendum)
Lunges -modification:  pay attention to maintaining perpendicular force through front shin bone  back leg and foot pointed forward,    Mini squat  Folded towel under arches of feet,  Band at thighs to track knees out along 2-3rd toes  10 reps x 1 day   ___  Stretch Pelvic floor  1) "v heels slide away and then back toward buttocks and then rock knee to slight out ,  slide heel  away from buttocks   10 reps       2) Happy baby pose Knees are towards chest and armpit direction, hands holding back of thigh "like baby getting their diaper changed" 5 reps    _____   Body scan ( email audio) for stress relief and calming to minimize urinary frequency

## 2018-04-24 ENCOUNTER — Ambulatory Visit: Payer: 59 | Admitting: Physical Therapy

## 2018-05-01 ENCOUNTER — Ambulatory Visit: Payer: 59 | Admitting: Physical Therapy

## 2018-05-08 ENCOUNTER — Ambulatory Visit: Payer: 59 | Admitting: Physical Therapy

## 2018-05-15 ENCOUNTER — Ambulatory Visit: Payer: 59 | Admitting: Physical Therapy

## 2018-05-22 ENCOUNTER — Ambulatory Visit: Payer: 59 | Attending: Obstetrics and Gynecology | Admitting: Physical Therapy

## 2018-05-29 ENCOUNTER — Telehealth: Payer: Self-pay | Admitting: Physical Therapy

## 2018-05-29 ENCOUNTER — Ambulatory Visit: Payer: 59 | Admitting: Physical Therapy

## 2018-05-29 NOTE — Telephone Encounter (Signed)
DPT left a message on pt's phone re: closure of Rehab services for 2 weeks. Advised pt to follow the COVID-19 guidelines. Plan to communicate w/ pt about r/s more appts when clinic opens.

## 2018-07-23 ENCOUNTER — Ambulatory Visit: Payer: 59 | Admitting: Physical Therapy

## 2018-07-25 ENCOUNTER — Ambulatory Visit: Payer: 59 | Attending: Obstetrics and Gynecology | Admitting: Physical Therapy

## 2019-11-05 IMAGING — CT CT ABD-PELV W/ CM
2 of 4 series · 16 of 46 positions shown, 18 images · IV contrast (iopamidol)
Comparison: None.

CLINICAL DATA: Abdominal pain with vomiting

EXAM:
CT ABDOMEN AND PELVIS WITH CONTRAST
TECHNIQUE: Multidetector CT imaging of the abdomen and pelvis was performed
using the standard protocol following bolus administration of
intravenous contrast.
CONTRAST:  100mL BM8WB1-RPP IOPAMIDOL (BM8WB1-RPP) INJECTION 61%

[Series 2: routine abd/pel with · axial · 0.76mm/px · z∈[-554,-134]mm · 13 of 92 slices shown, 15 images]
[im 4/92  soft-tissue]
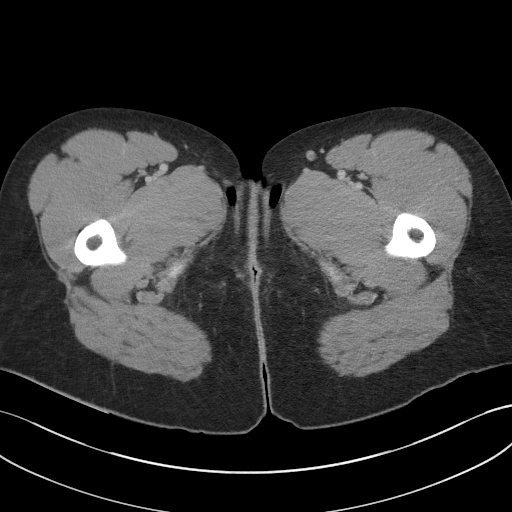
[im 4/92  bone]
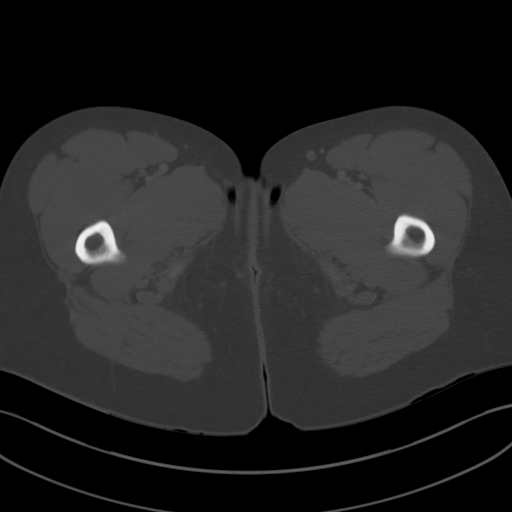
[im 12/92  soft-tissue]
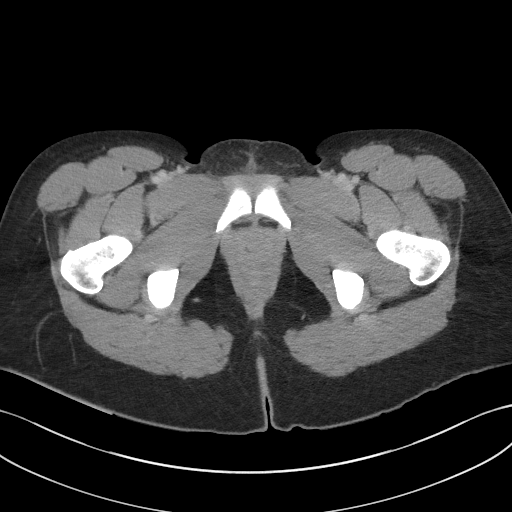
[im 20/92  soft-tissue]
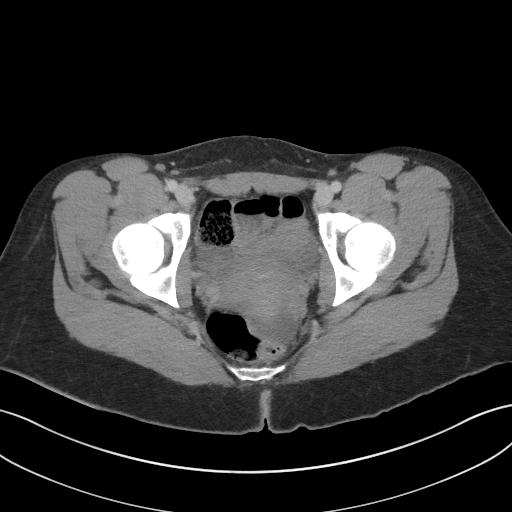
[im 24/92  soft-tissue]
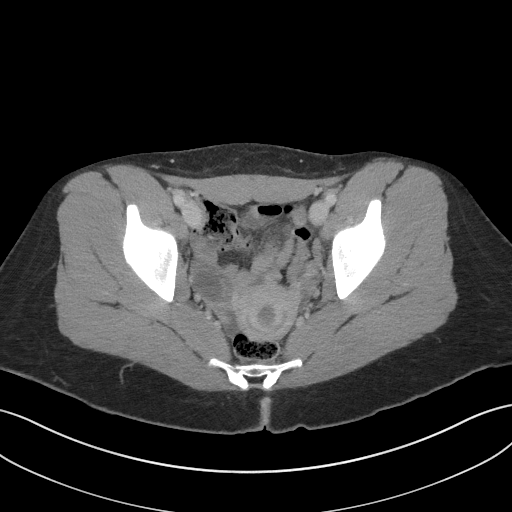
[im 32/92  soft-tissue]
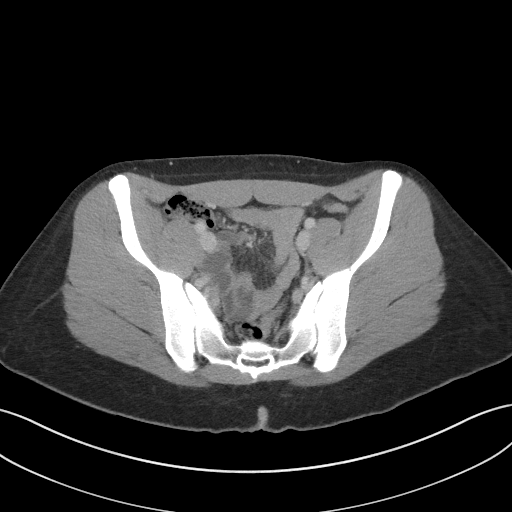
[im 40/92  soft-tissue]
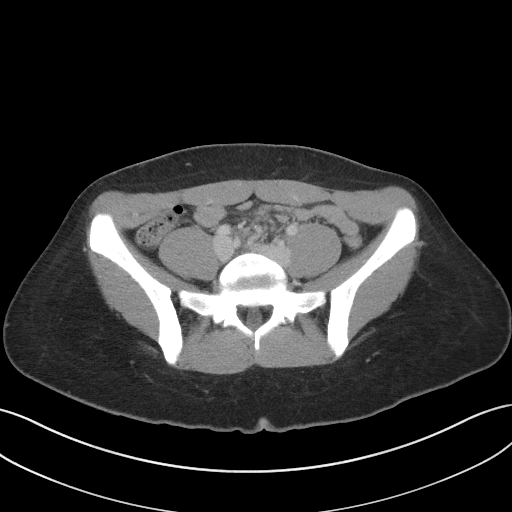
[im 48/92  soft-tissue]
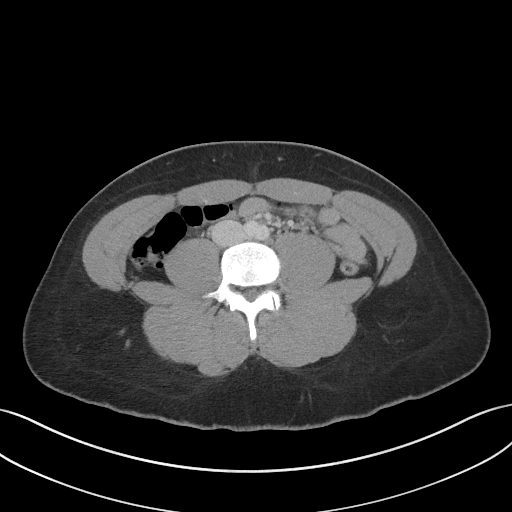
[im 52/92  soft-tissue]
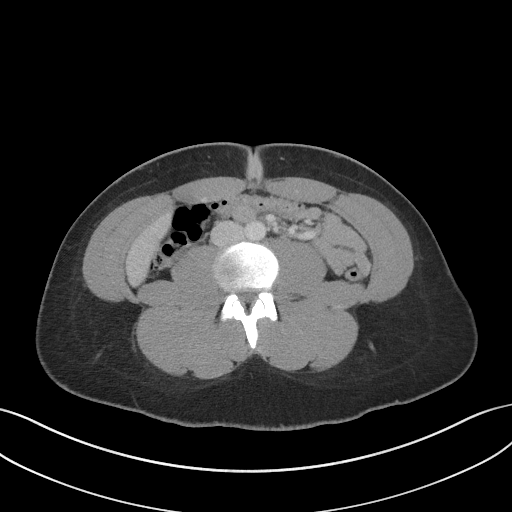
[im 60/92  soft-tissue]
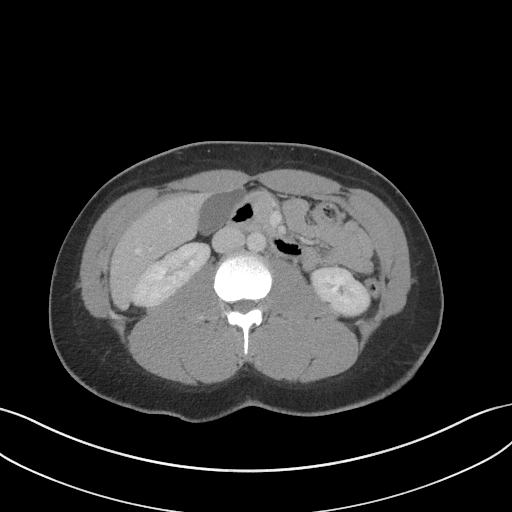
[im 60/92  bone]
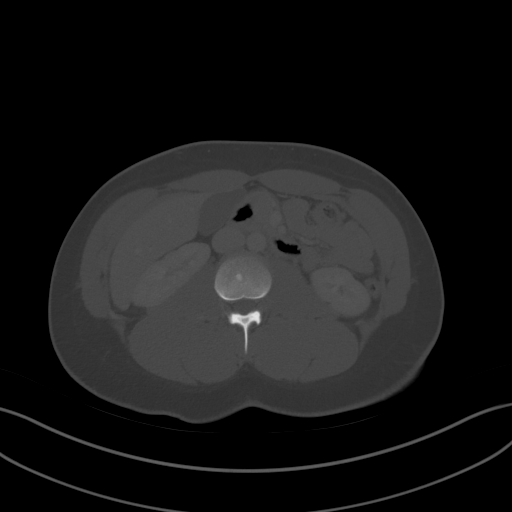
[im 68/92  soft-tissue]
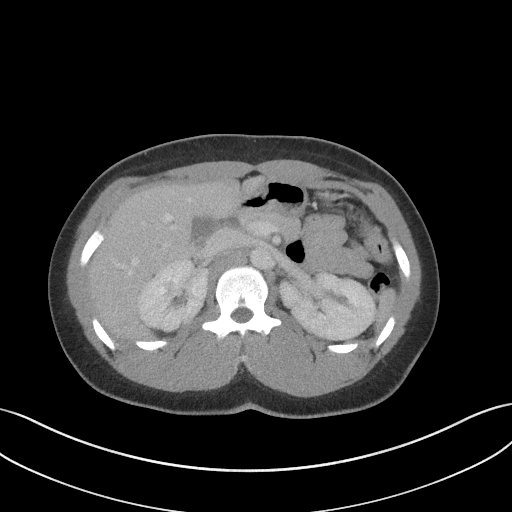
[im 72/92  soft-tissue]
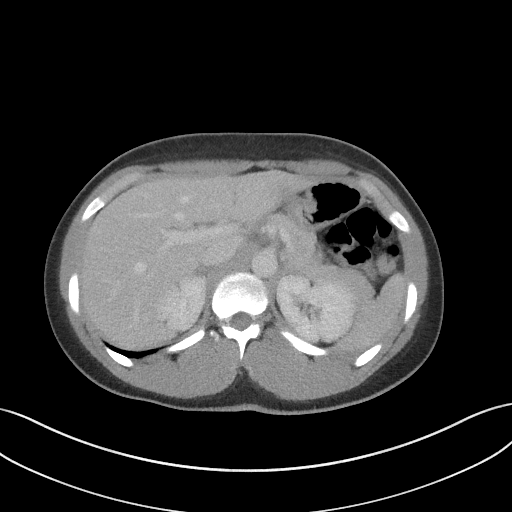
[im 80/92  soft-tissue]
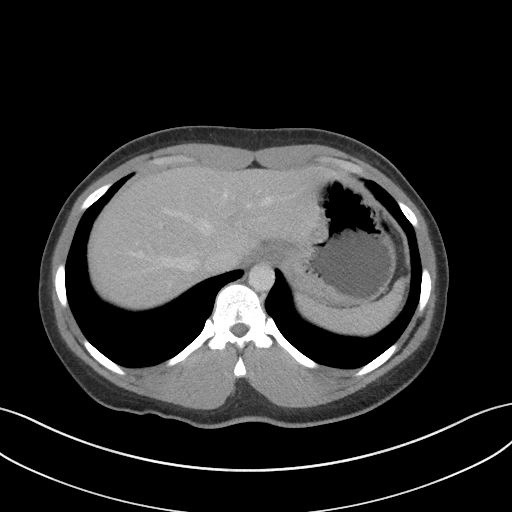
[im 88/92  soft-tissue]
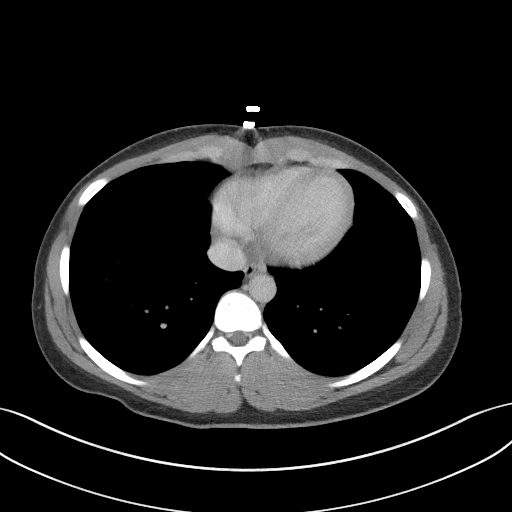

[Series 5: coronal st · coronal · 0.80mm/px · 3 of 74 slices shown]
[im 25/74  soft-tissue]
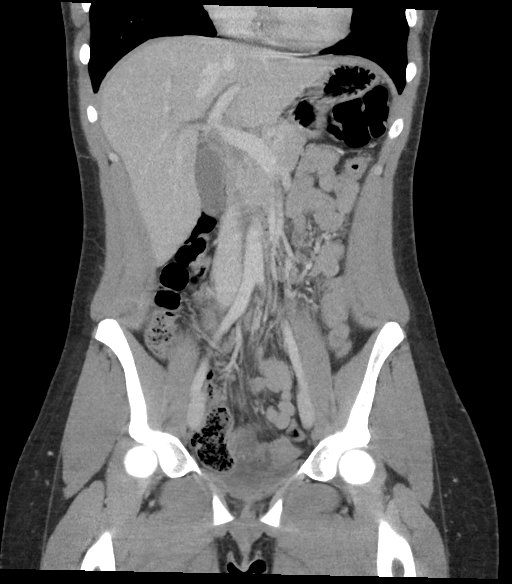
[im 33/74  soft-tissue]
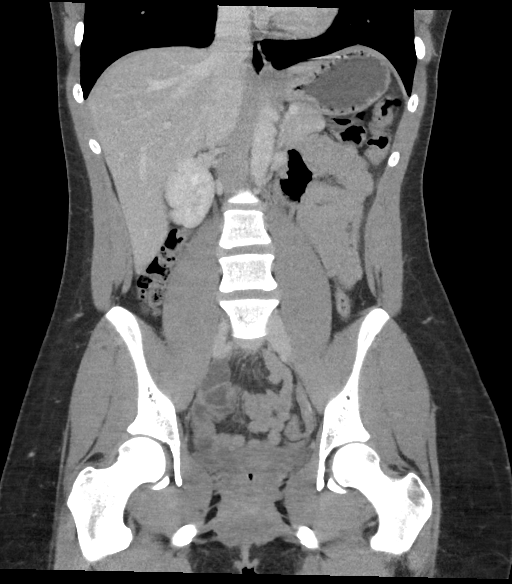
[im 41/74  soft-tissue]
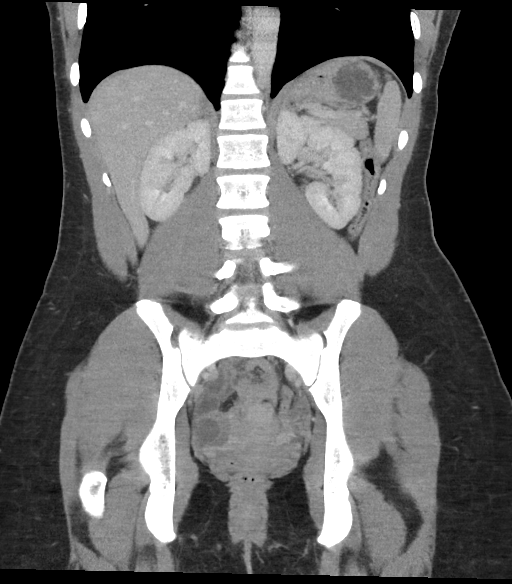

[16 of 46 positions shown; findings below may reference images not displayed]

FINDINGS: Lower chest: Lung bases are clear.

Hepatobiliary: No focal liver lesions are appreciable. Gallbladder
wall is not appreciably thickened. There is no biliary duct
dilatation.

Pancreas: No pancreatic mass or inflammatory focus.

Spleen: No splenic lesions are evident.

Adrenals/Urinary Tract: Adrenals bilaterally appear normal. Kidneys
bilaterally show no evident mass or hydronephrosis on either side.
There is a junctional parenchymal defect in the left kidney, an
anatomic variant. There is no evident renal or ureteral calculus on
either side. Urinary bladder is midline with wall thickness within
normal limits.

Stomach/Bowel: There is no appreciable bowel wall or mesenteric
thickening. No evident bowel obstruction. No free air or portal
venous air.

Vascular/Lymphatic: No abdominal aortic aneurysm. No vascular
lesions are evident. There is no appreciable adenopathy in the
abdomen or pelvis.

Reproductive: The uterus is retroverted. There is moderate fluid in
the endometrium. There is fluid in the cervix.

There is a small cystic area in the right ovary measuring 2.6 x
cm with adjacent fluid surrounding this cystic area as well as fluid
in the cul-de-sac region.

Other: Appendix appears normal. No abscess is evident in the abdomen
pelvis. No ascites evident beyond fluid in the cul-de-sac.

Musculoskeletal: No blastic or lytic bone lesions. No intramuscular
or abdominal wall lesion evident.
IMPRESSION: 1. Fluid in the endometrium and cervix. Uterus retroverted but not
appreciably enlarged.

2. Cyst versus dominant follicle right ovary with adjacent fluid as
well as fluid in the cul-de-sac. Suspect recent ovarian cyst
leakage/rupture.

3.  No bowel obstruction.  No abscess.  Appendix appears normal.

4.  No renal or ureteral calculus.  No hydronephrosis.

## 2021-11-15 ENCOUNTER — Ambulatory Visit
Admission: EM | Admit: 2021-11-15 | Discharge: 2021-11-15 | Disposition: A | Discharge status: Home / Self Care | Payer: 59 | Attending: Physician Assistant | Admitting: Physician Assistant

## 2021-11-15 ENCOUNTER — Encounter: Payer: Self-pay | Admitting: Emergency Medicine

## 2021-11-15 ENCOUNTER — Ambulatory Visit (INDEPENDENT_AMBULATORY_CARE_PROVIDER_SITE_OTHER): Payer: 59

## 2021-11-15 ENCOUNTER — Other Ambulatory Visit: Payer: Self-pay

## 2021-11-15 DIAGNOSIS — N3001 Acute cystitis with hematuria: Secondary | ICD-10-CM

## 2021-11-15 DIAGNOSIS — K5901 Slow transit constipation: Secondary | ICD-10-CM

## 2021-11-15 DIAGNOSIS — M545 Low back pain, unspecified: Secondary | ICD-10-CM | POA: Diagnosis present

## 2021-11-15 DIAGNOSIS — R109 Unspecified abdominal pain: Secondary | ICD-10-CM

## 2021-11-15 LAB — URINALYSIS, ROUTINE W REFLEX MICROSCOPIC
Bilirubin Urine: NEGATIVE
Glucose, UA: NEGATIVE mg/dL
Ketones, ur: NEGATIVE mg/dL
Nitrite: NEGATIVE
Protein, ur: 30 mg/dL — AB
Specific Gravity, Urine: 1.01 (ref 1.005–1.030)
pH: 5.5 (ref 5.0–8.0)

## 2021-11-15 LAB — PREGNANCY, URINE: Preg Test, Ur: NEGATIVE

## 2021-11-15 LAB — URINALYSIS, MICROSCOPIC (REFLEX): WBC, UA: >50 WBC/hpf (ref 0–5)

## 2021-11-15 MED ORDER — CIPROFLOXACIN HCL 500 MG PO TABS: 500.0000 mg | ORAL_TABLET | Freq: Two times a day (BID) | ORAL | 0 refills | Status: DC

## 2021-11-15 NOTE — ED Triage Notes (Signed)
Pt c/o left sided upper abdominal pain, and left rib pain. Started yesterday. She states is mildly nauseous but denies vomiting or diarrhea. She states she took gas pills last night.

## 2021-11-15 NOTE — Discharge Instructions (Addendum)
Your xray shows you have large amount of stool on the R colon, so take Mil of magnesia for 3 nights to see if it helps you empty out. I am placing you on antibiotics for bladder infections You may take Ibuprofen up to 800 mg every 8 hours as needed for your back pain.  If your L back pain gets worse, go to ER since you may need to CT scan to look for stone.

## 2021-11-15 NOTE — ED Provider Notes (Signed)
[facility]    CSN: [id number] Arrival date & time: 11/15/21  1156      History   Chief Complaint Chief Complaint  Patient presents with   Abdominal Pain    HPI Summer Martin is a 26 y.o. female who presents with LUQ pain and L rib pain  since yesterday. She has mild nausea, but has not had vomiting or diarrhea. Denies UTI symptoms. Denies work injury, but does work where she stoops over repetitively. She took gas pills last night, and when she woke up today felt a little better, but the pain is back.     Past Medical History:  Diagnosis Date   Allergy    Anxiety    Depression    Herpes    Sleep apnea    did not finish her sleep study 2014    There are no problems to display for this patient.   History reviewed. No pertinent surgical history.  OB History   No obstetric history on file.      Home Medications    Prior to Admission medications   Medication Sig Start Date End Date Taking? Authorizing Provider  busPIRone (BUSPAR) 10 MG tablet Take 10 mg by mouth once.   Yes [provider]  ciprofloxacin (CIPRO) 500 MG tablet Take 1 tablet (500 mg total) by mouth 2 (two) times daily. 11/15/21  Yes Rodriguez-Southworth, Nettie Elm, PA-C  levonorgestrel-ethinyl estradiol (NORDETTE) 0.15-30 MG-MCG tablet Take 1 tablet by mouth daily.   Yes [provider]  ranitidine (ZANTAC) 75 MG tablet Take 75 mg by mouth as needed for heartburn.   Yes [provider]  valACYclovir (VALTREX) 1000 MG tablet Take 1,000 mg by mouth daily.   Yes [provider]    Family History Family History  Problem Relation Age of Onset   Diabetes Mother    Hypertension Mother    Diabetes Maternal Grandfather     Social History Social History   Tobacco Use   Smoking status: Every Day    Packs/day: 2.00    Years: 4.00    Total pack years: 8.00    Types: Cigarettes, E-cigarettes  Vaping Use   Vaping Use: Some days  Substance Use Topics    Alcohol use: Yes     Allergies   Latex   Review of Systems Review of Systems  Constitutional:  Negative for fever.  Gastrointestinal:  Positive for abdominal pain and nausea. Negative for constipation, diarrhea and vomiting.  Genitourinary:  Negative for difficulty urinating, dysuria, frequency and urgency.  Musculoskeletal:  Positive for back pain.     Physical Exam Triage Vital Signs ED Triage Vitals  Enc Vitals Group     BP 11/15/21 1218 132/89     Pulse Rate 11/15/21 1218 60     Resp 11/15/21 1218 16     Temp 11/15/21 1218 98.4 F (36.9 C)     Temp Source 11/15/21 1218 Oral     SpO2 11/15/21 1218 99 %     Weight 11/15/21 1216 188 lb 0.8 oz (85.3 kg)     Height 11/15/21 1216 5\' 7"  (1.702 m)     Head Circumference --      Peak Flow --      Pain Score 11/15/21 1214 7     Pain Loc --      Pain Edu? --      Excl. in GC? --    No data found.  Updated Vital Signs BP 132/89 (BP Location: Left  Arm)   Pulse 60   Temp 98.4 F (36.9 C) (Oral)   Resp 16   Ht 5\' 7"  (1.702 m)   Wt 188 lb 0.8 oz (85.3 kg)   LMP 10/17/2021 (Approximate)   SpO2 99%   BMI 29.45 kg/m   Visual Acuity Right Eye Distance:   Left Eye Distance:   Bilateral Distance:    Right Eye Near:   Left Eye Near:    Bilateral Near:      Physical Exam Vitals and nursing note reviewed.  Constitutional:      General: She is not in acute distress.    Appearance: She is not toxic-appearing.  HENT:     Head: Normocephalic.     Right Ear: External ear normal.     Left Ear: External ear normal.  Eyes:     General: No scleral icterus.    Conjunctiva/sclera: Conjunctivae normal.  Pulmonary:     Effort: Pulmonary effort is normal.  Abdominal:     General: Bowel sounds are normal.     Palpations: Abdomen is soft. There is no mass.     Tenderness: There is no guarding or rebound.     Comments: - CVA tenderness   Musculoskeletal:        General: Normal range of motion.     Cervical back: Neck  supple.     Comments: BACK- + muscular tenderness on area of complaint with palpation  Skin:    General: Skin is warm and dry.     Findings: No rash.  Neurological:     Mental Status: She is alert and oriented to person, place, and time.     Gait: Gait normal.  Psychiatric:        Mood and Affect: Mood normal.        Behavior: Behavior normal.        Thought Content: Thought content normal.        Judgment: Judgment normal.    UC Treatments / Results  Labs (all labs ordered are listed, but only abnormal results are displayed) Labs Reviewed  URINALYSIS, ROUTINE W REFLEX MICROSCOPIC - Abnormal; Notable for the following components:      Result Value   APPearance HAZY (*)    Hgb urine dipstick MODERATE (*)    Protein, ur 30 (*)    Leukocytes,Ua MODERATE (*)    All other components within normal limits  URINALYSIS, MICROSCOPIC (REFLEX) - Abnormal; Notable for the following components:   Bacteria, UA MANY (*)    All other components within normal limits  URINE CULTURE  PREGNANCY, URINE   Pregnancy test is negative EKG   Radiology DG Abd 1 View  Result Date: 11/15/2021 CLINICAL DATA:  Left flank and rib pain EXAM: ABDOMEN - 1 VIEW COMPARISON:  None Available. FINDINGS: Nonobstructive pattern of bowel gas with gas present to the rectum. Large burden of stool in the colon. No free air. No radio-opaque calculi or other significant radiographic abnormality are seen. IMPRESSION: Nonobstructive pattern of bowel gas with gas present to the rectum. Large burden of stool in the colon. No free air. Electronically Signed   By: 01/15/2022 M.D.   On: 11/15/2021 13:39    Procedures Procedures (including critical care time)  Medications Ordered in UC Medications - No data to display  Initial Impression / Assessment and Plan / UC Course  I have reviewed the triage vital signs and the nursing notes.  Pertinent labs & imaging results that were available during  my care of the patient were  reviewed by me and considered in my medical decision making (see chart for details).   L lumbar pain UTI Constipation  I placed her on Cipro as noted. Urine was sent out for a culture and we will call her with results if we need to change her antibiotic. See instructions.     Final Clinical Impressions(s) / UC Diagnoses   Final diagnoses:  Pain in left lumbar region of back  Acute cystitis with hematuria  Slow transit constipation     Discharge Instructions      Your xray shows you have large amount of stool on the R colon, so take Mil of magnesia for 3 nights to see if it helps you empty out. I am placing you on antibiotics for bladder infections You may take Ibuprofen up to 800 mg every 8 hours as needed for your back pain.  If your L back pain gets worse, go to ER since you may need to CT scan to look for stone.      ED Prescriptions     Medication Sig Dispense Auth. Provider   ciprofloxacin (CIPRO) 500 MG tablet Take 1 tablet (500 mg total) by mouth 2 (two) times daily. 14 tablet Rodriguez-Southworth, Nettie Elm, PA-C      PDMP not reviewed this encounter.   Garey Ham, PA-C 11/15/21 1348

## 2021-11-17 ENCOUNTER — Emergency Department
Admission: EM | Admit: 2021-11-17 | Discharge: 2021-11-17 | Disposition: A | Discharge status: Home / Self Care | Payer: 59 | Attending: Emergency Medicine | Admitting: Emergency Medicine

## 2021-11-17 ENCOUNTER — Encounter: Payer: Self-pay | Admitting: Emergency Medicine

## 2021-11-17 ENCOUNTER — Other Ambulatory Visit: Payer: Self-pay

## 2021-11-17 DIAGNOSIS — R300 Dysuria: Secondary | ICD-10-CM | POA: Diagnosis present

## 2021-11-17 DIAGNOSIS — N12 Tubulo-interstitial nephritis, not specified as acute or chronic: Secondary | ICD-10-CM | POA: Insufficient documentation

## 2021-11-17 LAB — CBC
HCT: 40.3 % (ref 36.0–46.0)
Hemoglobin: 13 g/dL (ref 12.0–15.0)
MCH: 29.9 pg (ref 26.0–34.0)
MCHC: 32.3 g/dL (ref 30.0–36.0)
MCV: 92.6 fL (ref 80.0–100.0)
Platelets: 381 10*3/uL (ref 150–400)
RBC: 4.35 MIL/uL (ref 3.87–5.11)
RDW: 12.2 % (ref 11.5–15.5)
WBC: 14.3 10*3/uL — ABNORMAL HIGH (ref 4.0–10.5)
nRBC: 0 % (ref 0.0–0.2)

## 2021-11-17 LAB — URINE CULTURE: Culture: >=100000 — AB

## 2021-11-17 LAB — URINALYSIS, ROUTINE W REFLEX MICROSCOPIC
Bilirubin Urine: NEGATIVE
Glucose, UA: NEGATIVE mg/dL
Hgb urine dipstick: NEGATIVE
Ketones, ur: NEGATIVE mg/dL
Nitrite: NEGATIVE
Protein, ur: 30 mg/dL — AB
Specific Gravity, Urine: 1.016 (ref 1.005–1.030)
WBC, UA: >50 WBC/hpf — ABNORMAL HIGH (ref 0–5)
pH: 5 (ref 5.0–8.0)

## 2021-11-17 LAB — PREGNANCY, URINE: Preg Test, Ur: NEGATIVE

## 2021-11-17 LAB — BASIC METABOLIC PANEL
Anion gap: 8 (ref 5–15)
BUN: 12 mg/dL (ref 6–20)
CO2: 24 mmol/L (ref 22–32)
Calcium: 9 mg/dL (ref 8.9–10.3)
Chloride: 104 mmol/L (ref 98–111)
Creatinine, Ser: 1.23 mg/dL — ABNORMAL HIGH (ref 0.44–1.00)
GFR, Estimated: >60 mL/min (ref >60)
Glucose, Bld: 103 mg/dL — ABNORMAL HIGH (ref 70–99)
Potassium: 3.9 mmol/L (ref 3.5–5.1)
Sodium: 136 mmol/L (ref 135–145)

## 2021-11-17 MED ORDER — ACETAMINOPHEN 325 MG PO TABS
650.0000 mg | ORAL_TABLET | Freq: Once | ORAL | Status: AC
  Administered 2021-11-17: 650 mg via ORAL
  Filled 2021-11-17: qty 2

## 2021-11-17 MED ORDER — CEFTRIAXONE SODIUM 1 G IJ SOLR
1.0000 g | Freq: Once | INTRAMUSCULAR | Status: AC
  Administered 2021-11-17: 1 g via INTRAMUSCULAR
  Filled 2021-11-17: qty 10

## 2021-11-17 MED ORDER — CIPROFLOXACIN HCL 500 MG PO TABS: 500.0000 mg | ORAL_TABLET | Freq: Two times a day (BID) | ORAL | 0 refills | Status: AC

## 2021-11-17 NOTE — ED Triage Notes (Signed)
Pt via POV from home. Pt c/o L sided flank pain since Sunday. Pt endorses nausea with no vomiting or diarrhea. Denies hx of kidney stone. Denies urinary sx. Pt is A&Ox4 and NAD

## 2021-11-17 NOTE — ED Provider Notes (Signed)
[facility] Provider Note  Patient Contact: 8:52 PM (approximate)   History   Flank Pain   HPI  Summer Martin is a 26 y.o. female who presents the emergency department complaining of dysuria, polyuria, flank pain, low-grade fever.  Patient states that she was seen at urgent care, diagnosed with a UTI and had an antibiotic that was supposed to be called in.  Patient was supposed to be on Cipro.  Culture came back confirming that Cipro had good coverage for E. coli found in patient's urine.  Patient states that the antibiotic was never called into her pharmacy and now she has pain that originally was in her lower pelvis extending into the left flank at this time.  No history of kidney stone.  Patient did have a low-grade fever but has not taken any antipyretics.  She still eating, drinking, still going about her daily activities as normal.  Patient is here for further evaluation and management of her suspected UTI.     Physical Exam   Triage Vital Signs: ED Triage Vitals  Enc Vitals Group     BP 11/17/21 1819 130/79     Pulse Rate 11/17/21 1819 93     Resp 11/17/21 1819 16     Temp 11/17/21 1819 (!) 100.7 F (38.2 C)     Temp Source 11/17/21 1819 Oral     SpO2 11/17/21 1819 99 %     Weight 11/17/21 1743 240 lb (108.9 kg)     Height 11/17/21 1743 5\' 7"  (1.702 m)     Head Circumference --      Peak Flow --      Pain Score 11/17/21 1743 8     Pain Loc --      Pain Edu? --      Excl. in GC? --     Most recent vital signs: Vitals:   11/17/21 1819  BP: 130/79  Pulse: 93  Resp: 16  Temp: (!) 100.7 F (38.2 C)  SpO2: 99%     General: Alert and in no acute distress.  Cardiovascular:  Good peripheral perfusion Respiratory: Normal respiratory effort without tachypnea or retractions. Lungs CTAB.  Gastrointestinal: Bowel sounds 4 quadrants. Soft and nontender to palpation. No guarding or rigidity. No palpable masses. No distention.  Left-sided CVA  tenderness. Musculoskeletal: Full range of motion to all extremities.  Neurologic:  No gross focal neurologic deficits are appreciated.  Skin:   No rash noted Other:   ED Results / Procedures / Treatments   Labs (all labs ordered are listed, but only abnormal results are displayed) Labs Reviewed  URINALYSIS, ROUTINE W REFLEX MICROSCOPIC - Abnormal; Notable for the following components:      Result Value   Color, Urine YELLOW (*)    APPearance HAZY (*)    Protein, ur 30 (*)    Leukocytes,Ua SMALL (*)    WBC, UA >50 (*)    Bacteria, UA RARE (*)    All other components within normal limits  BASIC METABOLIC PANEL - Abnormal; Notable for the following components:   Glucose, Bld 103 (*)    Creatinine, Ser 1.23 (*)    All other components within normal limits  CBC - Abnormal; Notable for the following components:   WBC 14.3 (*)    All other components within normal limits  PREGNANCY, URINE     EKG     RADIOLOGY    No results found.  PROCEDURES:  Critical Care performed: No  Procedures   MEDICATIONS ORDERED IN ED: Medications  cefTRIAXone (ROCEPHIN) injection 1 g (has no administration in time range)  acetaminophen (TYLENOL) tablet 650 mg (650 mg Oral Given 11/17/21 1825)     IMPRESSION / MDM / ASSESSMENT AND PLAN / ED COURSE  I reviewed the triage vital signs and the nursing notes.                              Differential diagnosis includes, but is not limited to, pyelonephritis, UTI, nephrolithiasis, ovarian cyst, ovarian torsion, ectopic pregnancy, pregnancy  Patient's presentation is most consistent with acute presentation with potential threat to life or bodily function.   Patient's diagnosis is consistent with pyelonephritis.  Patient presented to the emergency department with initially UTI symptoms that have ascended to the left flank pain.  Patient was seen in urgent care, had urinalysis which was concerning for UTI confirmed with urine culture with  E. coli.  Patient was supposed to have Cipro prescribed for her but this was never called into her pharmacy.  At this time patient did have a low-grade fever, elevation in white blood cell count and findings consistent with UTI.  She does not appear toxic at this time and I feel that patient would be a good candidate for IM Rocephin and outpatient oral antibiotics.  I have cautioned concerning/worsening symptoms to return to the ED but at this time do not feel that the patient needs to be admitted for IV antibiotics.  Patient will be prescribed Cipro as this has good coverage as identified on the previous urine culture.  I have cautioned that the patient should use a probiotic with her Cipro.  Follow primary care as needed.  Patient is given ED precautions to return to the ED for any worsening or new symptoms.        FINAL CLINICAL IMPRESSION(S) / ED DIAGNOSES   Final diagnoses:  Pyelonephritis     Rx / DC Orders   ED Discharge Orders          Ordered    ciprofloxacin (CIPRO) 500 MG tablet  2 times daily        11/17/21 2049             Note:  This document was prepared using Dragon voice recognition software and may include unintentional dictation errors.   Lanette Hampshire 11/17/21 2100    Minna Antis, MD 11/17/21 2329

## 2023-09-29 ENCOUNTER — Telehealth: Payer: Self-pay

## 2023-09-29 DIAGNOSIS — Z7189 Other specified counseling: Secondary | ICD-10-CM

## 2023-09-29 NOTE — Progress Notes (Signed)
 Complex Care Management Note  Care Guide Note 09/29/2023 Name: Summer Martin MRN: [id number] DOB: [birth year]  Summer Martin is a 28 y.o. year old female who sees Leopold, Belvie RAMAN, MD for primary care. I reached out to [place] by phone today to offer complex care management services.  [name] was given information about Complex Care Management services today including:   The Complex Care Management services include support from the care team which includes your Nurse Care Manager, Clinical Social Worker, or Pharmacist.  The Complex Care Management team is here to help remove barriers to the health concerns and goals most important to you. Complex Care Management services are voluntary, and the patient may decline or stop services at any time by request to their care team member.   Complex Care Management Consent Status: Patient did not agree to participate in complex care management services at this time.  Follow up plan:    Encounter Outcome:  Patient Refused  Jeoffrey Buffalo , RMA     [facility]  [facility], [facility] Guide  Direct Dial: [phone]  Website: [email or web address]
